# Patient Record
Sex: Female | Born: 1975 | Race: Black or African American | Hispanic: No | Marital: Single | Smoking: Current every day smoker
Health system: Southern US, Community
[De-identification: ages and names within clinical notes are randomized; demographics above are authoritative.]

## PROBLEM LIST (undated history)

## (undated) DIAGNOSIS — Z8669 Personal history of other diseases of the nervous system and sense organs: Secondary | ICD-10-CM

## (undated) HISTORY — PX: TUBAL LIGATION: SHX77

## (undated) HISTORY — DX: Personal history of other diseases of the nervous system and sense organs: Z86.69

---

## 1997-08-08 ENCOUNTER — Inpatient Hospital Stay (HOSPITAL_COMMUNITY): Admission: AD | Admit: 1997-08-08 | Discharge: 1997-08-11 | Payer: Self-pay | Admitting: Obstetrics and Gynecology

## 1999-12-11 ENCOUNTER — Other Ambulatory Visit: Admission: RE | Admit: 1999-12-11 | Discharge: 1999-12-11 | Payer: Self-pay | Admitting: Obstetrics & Gynecology

## 2000-05-08 ENCOUNTER — Emergency Department (HOSPITAL_COMMUNITY): Admission: EM | Admit: 2000-05-08 | Discharge: 2000-05-08 | Payer: Self-pay | Admitting: Emergency Medicine

## 2000-05-17 ENCOUNTER — Encounter: Payer: Self-pay | Admitting: Gastroenterology

## 2000-05-17 ENCOUNTER — Encounter: Admission: RE | Admit: 2000-05-17 | Discharge: 2000-05-17 | Payer: Self-pay | Admitting: Gastroenterology

## 2000-12-10 ENCOUNTER — Other Ambulatory Visit: Admission: RE | Admit: 2000-12-10 | Discharge: 2000-12-10 | Payer: Self-pay | Admitting: Obstetrics and Gynecology

## 2001-01-10 ENCOUNTER — Inpatient Hospital Stay (HOSPITAL_COMMUNITY): Admission: AD | Admit: 2001-01-10 | Discharge: 2001-01-10 | Payer: Self-pay | Admitting: Obstetrics and Gynecology

## 2001-01-11 ENCOUNTER — Encounter: Payer: Self-pay | Admitting: Obstetrics and Gynecology

## 2001-06-21 ENCOUNTER — Inpatient Hospital Stay (HOSPITAL_COMMUNITY): Admission: AD | Admit: 2001-06-21 | Discharge: 2001-06-21 | Payer: Self-pay | Admitting: Obstetrics and Gynecology

## 2001-07-10 ENCOUNTER — Inpatient Hospital Stay (HOSPITAL_COMMUNITY): Admission: AD | Admit: 2001-07-10 | Discharge: 2001-07-12 | Payer: Self-pay | Admitting: Obstetrics and Gynecology

## 2002-11-11 ENCOUNTER — Other Ambulatory Visit: Admission: RE | Admit: 2002-11-11 | Discharge: 2002-11-11 | Payer: Self-pay | Admitting: Obstetrics and Gynecology

## 2003-06-09 ENCOUNTER — Emergency Department (HOSPITAL_COMMUNITY): Admission: EM | Admit: 2003-06-09 | Discharge: 2003-06-09 | Payer: Self-pay | Admitting: Emergency Medicine

## 2012-10-13 ENCOUNTER — Telehealth: Payer: Self-pay | Admitting: *Deleted

## 2012-10-14 ENCOUNTER — Encounter: Payer: Self-pay | Admitting: Family Medicine

## 2012-10-17 ENCOUNTER — Ambulatory Visit: Payer: Self-pay | Admitting: Family Medicine

## 2012-10-22 NOTE — Telephone Encounter (Signed)
error 

## 2013-03-23 ENCOUNTER — Ambulatory Visit (INDEPENDENT_AMBULATORY_CARE_PROVIDER_SITE_OTHER): Payer: BC Managed Care – PPO | Admitting: Family Medicine

## 2013-03-23 ENCOUNTER — Encounter: Payer: Self-pay | Admitting: Family Medicine

## 2013-03-23 VITALS — BP 140/92 | HR 72 | Resp 16 | Ht 63.0 in | Wt 160.0 lb

## 2013-03-23 DIAGNOSIS — R5381 Other malaise: Secondary | ICD-10-CM

## 2013-03-23 DIAGNOSIS — Z13220 Encounter for screening for lipoid disorders: Secondary | ICD-10-CM

## 2013-03-23 DIAGNOSIS — Z23 Encounter for immunization: Secondary | ICD-10-CM

## 2013-03-23 DIAGNOSIS — R21 Rash and other nonspecific skin eruption: Secondary | ICD-10-CM

## 2013-03-23 DIAGNOSIS — N643 Galactorrhea not associated with childbirth: Secondary | ICD-10-CM

## 2013-03-23 MED ORDER — TRIAMCINOLONE ACETONIDE 0.1 % EX CREA: TOPICAL_CREAM | Freq: Two times a day (BID) | CUTANEOUS | Status: AC

## 2013-03-23 NOTE — Progress Notes (Signed)
  Subjective:    Patient ID: Gabrielle Yang, female    DOB: 1975/06/26, 37 y.o.   MRN: [id number]  HPI  Gabrielle Yang is here today to discuss the conditions listed below:    1)  Hand Rash:  Her daughter was recently diagnosed with scabies.  Her daughter's doctor gave treatment for the entire family and she has been applying this cream.  She feels that she has over-treated her hands and they have been itching and feeling extremely hot.    2)  Galactorrhea:  She has noted some milk discharge on several occasions.  She is concerned about her thyroid level since her daughter was recently diagnosed with thyroid disease.    Review of Systems  Constitutional: Negative.   HENT: Negative.   Eyes: Negative.   Respiratory: Negative.   Cardiovascular: Negative.   Gastrointestinal: Negative.   Endocrine:       Milk Discharge  Genitourinary: Negative.   Musculoskeletal: Negative.   Skin: Positive for rash.       Both hands  Allergic/Immunologic: Negative.   Neurological: Negative.   Hematological: Negative.   Psychiatric/Behavioral: Negative.      Past Medical History  Diagnosis Date  . History of migraine headaches      Family History  Problem Relation Age of Onset  . Cancer Mother     Uterine Cancer  . Hypertension Father   . Hypothyroidism Sister   . Hypothyroidism Daughter      History   Social History Narrative   Marital Status:  Engaged Air traffic controller)    Children:  Daughters (Yvonna Alanis, McNary)    Pets:  None    Living Situation: Lives with fiance and daughters.     Occupation:  Social worker    Tobacco Use/Exposure:  She smokes < 1/2 pack per week.    Alcohol Use:  Rarely    Drug Use:  None   Diet:  Regular   Exercise:  Walking    Hobbies:  Reading     No Known Allergies     Objective:   Physical Exam  Vitals reviewed. Constitutional: She is oriented to person, place, and time. She appears  well-developed and well-nourished.  Cardiovascular: Normal rate and regular rhythm.   Pulmonary/Chest: Effort normal and breath sounds normal.  Neurological: She is alert and oriented to person, place, and time.  Skin: Skin is warm and dry.  Psychiatric: She has a normal mood and affect.      Assessment & Plan:    Gabrielle Yang was seen today for rash and galactorrhea.  Diagnoses and associated orders for this visit:  Rash and nonspecific skin eruption - triamcinolone cream (KENALOG) 0.1 %; Apply topically 2 (two) times daily.  Galactorrhea - Prolactin; Future - B-HCG Quant; Future  Other malaise and fatigue - CBC with Differential; Future - COMPLETE METABOLIC PANEL WITH GFR; Future - TSH; Future  Screening, lipid - Cholesterol, Total; Future  Need for prophylactic vaccination and inoculation against influenza - Flu Vaccine QUAD 36+ mos PF IM (Fluarix)   TIME SPENT "FACE TO FACE" WITH PATIENT -  30 MINS

## 2013-04-21 DIAGNOSIS — Z23 Encounter for immunization: Secondary | ICD-10-CM | POA: Insufficient documentation

## 2013-04-21 DIAGNOSIS — R21 Rash and other nonspecific skin eruption: Secondary | ICD-10-CM | POA: Insufficient documentation

## 2013-04-21 DIAGNOSIS — N643 Galactorrhea not associated with childbirth: Secondary | ICD-10-CM | POA: Insufficient documentation

## 2013-05-14 NOTE — Assessment & Plan Note (Signed)
The patient confirmed that they are not allergic to eggs and have never had a bad reaction with the flu shot in the past.  The vaccination was given without difficulty.   

## 2013-05-24 DIAGNOSIS — R5381 Other malaise: Secondary | ICD-10-CM | POA: Insufficient documentation

## 2013-05-24 DIAGNOSIS — Z13220 Encounter for screening for lipoid disorders: Secondary | ICD-10-CM | POA: Insufficient documentation

## 2013-08-05 ENCOUNTER — Other Ambulatory Visit: Payer: BC Managed Care – PPO

## 2013-08-07 ENCOUNTER — Other Ambulatory Visit: Payer: BC Managed Care – PPO

## 2013-08-12 ENCOUNTER — Ambulatory Visit: Payer: BC Managed Care – PPO | Admitting: Family Medicine

## 2016-01-12 ENCOUNTER — Emergency Department (HOSPITAL_BASED_OUTPATIENT_CLINIC_OR_DEPARTMENT_OTHER): Payer: BLUE CROSS/BLUE SHIELD

## 2016-01-12 ENCOUNTER — Encounter (HOSPITAL_BASED_OUTPATIENT_CLINIC_OR_DEPARTMENT_OTHER): Payer: Self-pay

## 2016-01-12 ENCOUNTER — Emergency Department (HOSPITAL_BASED_OUTPATIENT_CLINIC_OR_DEPARTMENT_OTHER)
Admission: EM | Admit: 2016-01-12 | Discharge: 2016-01-12 | Disposition: A | Discharge status: Home / Self Care | Payer: BLUE CROSS/BLUE SHIELD | Attending: Emergency Medicine | Admitting: Emergency Medicine

## 2016-01-12 DIAGNOSIS — R1032 Left lower quadrant pain: Secondary | ICD-10-CM | POA: Insufficient documentation

## 2016-01-12 DIAGNOSIS — F17210 Nicotine dependence, cigarettes, uncomplicated: Secondary | ICD-10-CM | POA: Insufficient documentation

## 2016-01-12 LAB — URINE MICROSCOPIC-ADD ON

## 2016-01-12 LAB — URINALYSIS, ROUTINE W REFLEX MICROSCOPIC
BILIRUBIN URINE: NEGATIVE
Glucose, UA: NEGATIVE mg/dL
Ketones, ur: NEGATIVE mg/dL
LEUKOCYTES UA: NEGATIVE
NITRITE: NEGATIVE
PH: 5.5 (ref 5.0–8.0)
Protein, ur: NEGATIVE mg/dL
SPECIFIC GRAVITY, URINE: 1.021 (ref 1.005–1.030)

## 2016-01-12 LAB — WET PREP, GENITAL
SPERM: NONE SEEN
Trich, Wet Prep: NONE SEEN
YEAST WET PREP: NONE SEEN

## 2016-01-12 LAB — PREGNANCY, URINE: Preg Test, Ur: NEGATIVE

## 2016-01-12 MED ORDER — IBUPROFEN 800 MG PO TABS: 800.0000 mg | ORAL_TABLET | Freq: Three times a day (TID) | ORAL | 0 refills | Status: AC

## 2016-01-12 MED FILL — IBUPROFEN 800 MG TABLET: 800 | 7 days supply | Qty: 21 | Fill #0

## 2016-01-12 NOTE — ED Provider Notes (Signed)
[facility]   CSN: [id number] Arrival date & time: 01/12/16  1241  First Provider Contact:  None       History   Chief Complaint Chief Complaint  Patient presents with  . Abdominal Pain    HPI AMAIRANI SHUEY is a 40 y.o. female.  HPI  Patient is a pleasant 40 year old female presenting with pain in her left plain gradient to her left groin. This going on for 3 weeks. Patient went to urgent care and was diagnosed with pelvic pain NOS. Patient's been unable to get into her primary care physician. Patient has not had any discharge. Patient has been menstruating. Patient denies any nausea vomiting diarrhea. No fevers. No new vaginal discharge.   Past Medical History:  Diagnosis Date  . History of migraine headaches     Patient Active Problem List   Diagnosis Date Noted  . Screening, lipid 05/24/2013  . Other malaise and fatigue 05/24/2013  . Galactorrhea 04/21/2013  . Need for prophylactic vaccination and inoculation against influenza 04/21/2013  . Rash and nonspecific skin eruption 04/21/2013    Past Surgical History:  Procedure Laterality Date  . TUBAL LIGATION    . TUBAL LIGATION      OB History    No data available       Home Medications    Prior to Admission medications   Not on File    Family History Family History  Problem Relation Age of Onset  . Cancer Mother     Uterine Cancer  . Hypertension Father   . Hypothyroidism Sister   . Hypothyroidism Daughter     Social History Social History  Substance Use Topics  . Smoking status: Current Every Day Smoker    Packs/day: 0.25    Types: Cigarettes  . Smokeless tobacco: Never Used  . Alcohol use Yes     Comment: Occasional      Allergies   Review of patient's allergies indicates no known allergies.   Review of Systems Review of Systems  Constitutional: Negative for activity change.  Respiratory: Negative for shortness of breath.   Cardiovascular: Negative  for chest pain.  Gastrointestinal: Positive for abdominal pain. Negative for diarrhea, nausea and vomiting.  Genitourinary: Negative for dysuria.  All other systems reviewed and are negative.    Physical Exam Updated Vital Signs BP 132/95 (BP Location: Left Arm)   Pulse 96   Temp 98.4 F (36.9 C) (Oral)   Resp 18   Ht  (1.6 m)   Wt 175 lb (79.4 kg)   LMP 01/08/2016   SpO2 98%   BMI 31.00 kg/m   Physical Exam  Constitutional: She appears well-developed and well-nourished. No distress.  HENT:  Head: Normocephalic and atraumatic.  Eyes: Conjunctivae are normal.  Neck: Neck supple.  Cardiovascular: Normal rate and regular rhythm.   No murmur heard. Pulmonary/Chest: Effort normal and breath sounds normal. No respiratory distress.  Abdominal: Soft. There is tenderness.  CVA tenderness, mild tenderness left groin.  Genitourinary:  Genitourinary Comments: nromal vaginal exam no tenderness no cmt no discharge, +scant blood  Musculoskeletal: She exhibits no edema.  Neurological: She is alert.  Skin: Skin is warm and dry.  Psychiatric: She has a normal mood and affect.  Nursing note and vitals reviewed.    ED Treatments / Results  Labs (all labs ordered are listed, but only abnormal results are displayed) Labs Reviewed  URINALYSIS, ROUTINE W REFLEX MICROSCOPIC (NOT AT [facility]) - Abnormal; Notable for  the following:       Result Value   APPearance CLOUDY (*)    Hgb urine dipstick SMALL (*)    All other components within normal limits  URINE MICROSCOPIC-ADD ON - Abnormal; Notable for the following:    Squamous Epithelial / LPF 0-5 (*)    Bacteria, UA RARE (*)    All other components within normal limits  URINE CULTURE  WET PREP, GENITAL  PREGNANCY, URINE  GC/CHLAMYDIA PROBE AMP () NOT AT [facility]    EKG  EKG Interpretation None       Radiology Ct Renal Stone Study  Result Date: 01/12/2016 CLINICAL DATA:  Left flank and lower back pain. EXAM: CT  ABDOMEN AND PELVIS WITHOUT CONTRAST TECHNIQUE: Multidetector CT imaging of the abdomen and pelvis was performed following the standard protocol without IV contrast. COMPARISON:  Report dated 05/08/2000. FINDINGS: Lower chest:  3 mm nodule in the right lower lobe on image number 3. Hepatobiliary: Unremarkable liver and gallbladder. Pancreas: No mass or inflammatory process identified on this un-enhanced exam. Spleen: Within normal limits in size and appearance. Adrenals/Urinary Tract: Normal appearing adrenal glands, kidneys, ureters and urinary bladder. No calculi or hydronephrosis. No visible mass. Stomach/Bowel: No gastrointestinal abnormalities. Normal appearing appendix. Vascular/Lymphatic: No pathologically enlarged lymph nodes. No evidence of abdominal aortic aneurysm. Reproductive: No mass or other significant abnormality. Other: Very small umbilical hernia containing fat. Musculoskeletal: Facet degenerative changes at the L5-S1 level. Mild anterior spur formation at multiple levels of the lumbar and lower thoracic spine. IMPRESSION: 1. No acute abnormality. 2. 3 mm right lower lobe nodule. No follow-up needed if patient is low-risk. Non-contrast chest CT can be considered in 12 months if patient is high-risk. This recommendation follows the consensus statement: Guidelines for Management of Incidental Pulmonary Nodules Detected on CT Images:From the Fleischner Society 2017; published online before print (10.1148/radiol.[id number]). Electronically Signed   By: Beckie Salts M.D.   On: 01/12/2016 13:51    Procedures Procedures (including critical care time)  Medications Ordered in ED Medications - No data to display   Initial Impression / Assessment and Plan / ED Course  I have reviewed the triage vital signs and the nursing notes.  Pertinent labs & imaging results that were available during my care of the patient were reviewed by me and considered in my medical decision making (see chart for  details).  Clinical Course    Patient is a 40 year old female presenting with left flank pain rating to groin. She's had this for 3 weeks. Called her PCP X2 and went to urgent care. She reports that is colicky in nature. She's never had a history of kidney stones. Patient has had no new vaginal discharge. No urinary symptoms. We'll get CT stone. Patient otherwise appears very well with normal vital signs afebrile.  3:17 PM Normal CT, vaginal exam noraml.  Will get TVUS. If negative will have her follow up with PCP.     Final Clinical Impressions(s) / ED Diagnoses   Final diagnoses:  None    New Prescriptions New Prescriptions   No medications on file     Dariyon Urquilla Randall An, MD 01/12/16 [phone]

## 2016-01-12 NOTE — ED Notes (Signed)
Patient transported to Ultrasound 

## 2016-01-12 NOTE — ED Triage Notes (Signed)
C/o lower abd pain since Sunday-diarrhea, vaginal d/c-lower back pain x 2 weeks-NAD-steady gait

## 2016-01-12 NOTE — Discharge Instructions (Signed)
We are sorry we could not get to the bottom of why you are having this pain. Please follow up with your regular phsyician and return with any concerns, fever, or increasing pain.

## 2016-01-13 LAB — GC/CHLAMYDIA PROBE AMP (~~LOC~~) NOT AT ARMC
Chlamydia: NEGATIVE
Neisseria Gonorrhea: NEGATIVE

## 2016-01-13 LAB — URINE CULTURE

## 2016-12-30 IMAGING — US US TRANSVAGINAL NON-OB
1 series · 13 of 25 positions shown · non-contrast
Comparison: None in PACs

CLINICAL DATA: Left flank pain radiating to the left lower quadrant
for the past 2 weeks; irregular menses, bilateral tubal ligation.

EXAM:
TRANSABDOMINAL AND TRANSVAGINAL ULTRASOUND OF PELVIS
TECHNIQUE: Both transabdominal and transvaginal ultrasound examinations of the
pelvis were performed. Transabdominal technique was performed for
global imaging of the pelvis including uterus, ovaries, adnexal
regions, and pelvic cul-de-sac. It was necessary to proceed with
endovaginal exam following the transabdominal exam to visualize the
uterus, endometrium, ovaries, and adnexal structures..

[Series 1: us transvaginal non-ob · 0.25mm/px · 13 of 46 slices shown]
[im 1/46]
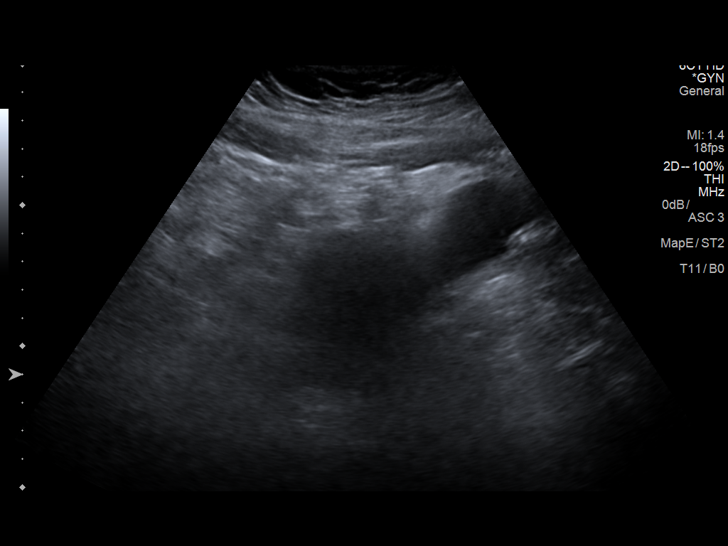
[im 4/46]
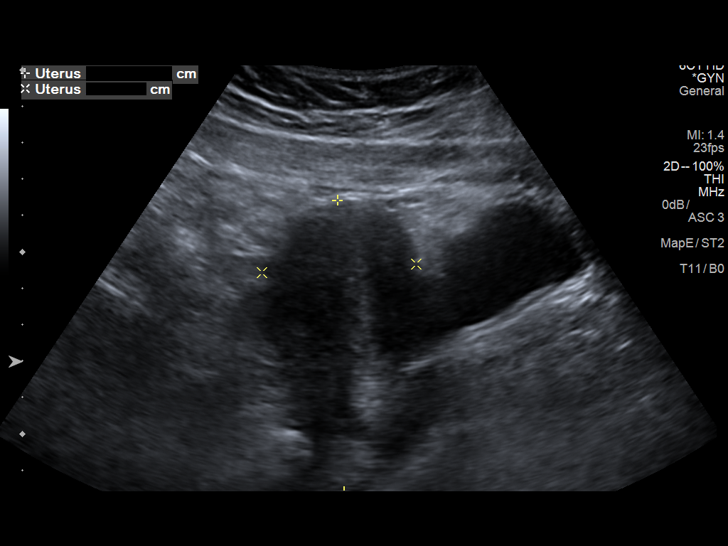
[im 8/46]
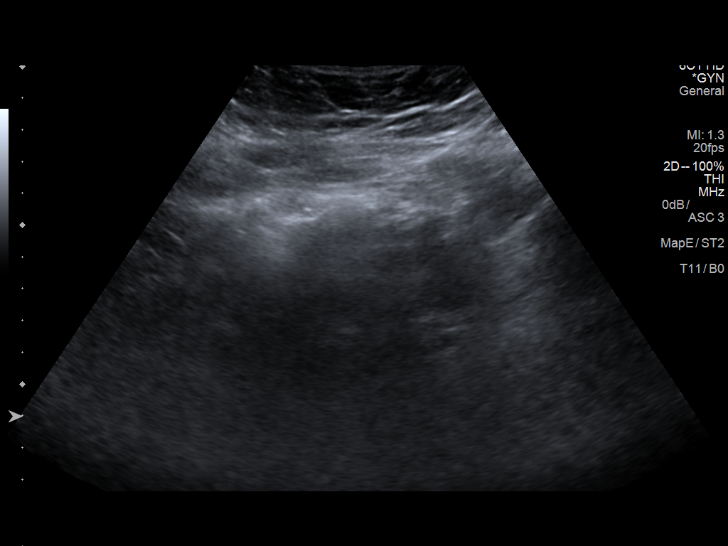
[im 12/46]
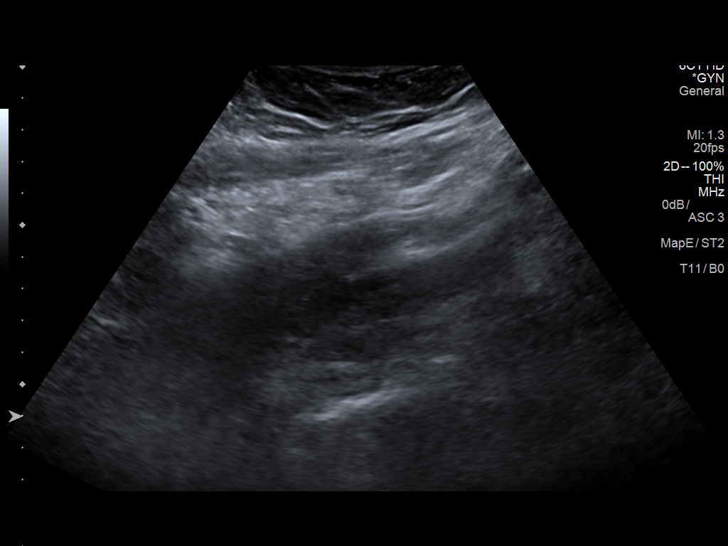
[im 16/46]
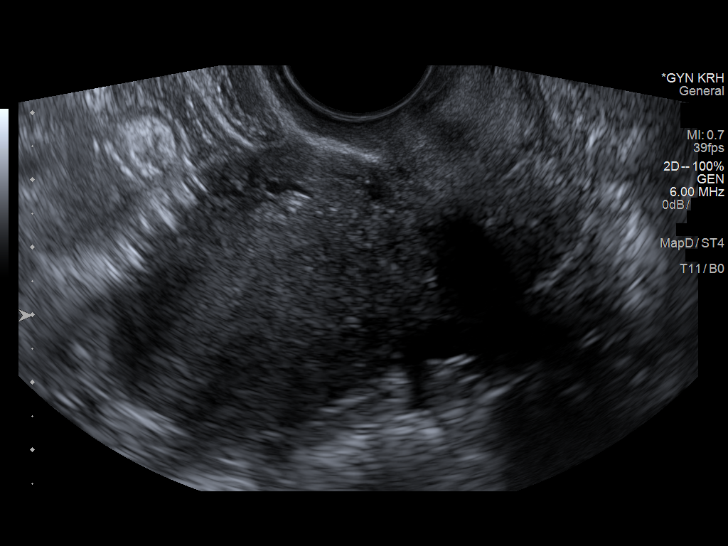
[im 19/46]
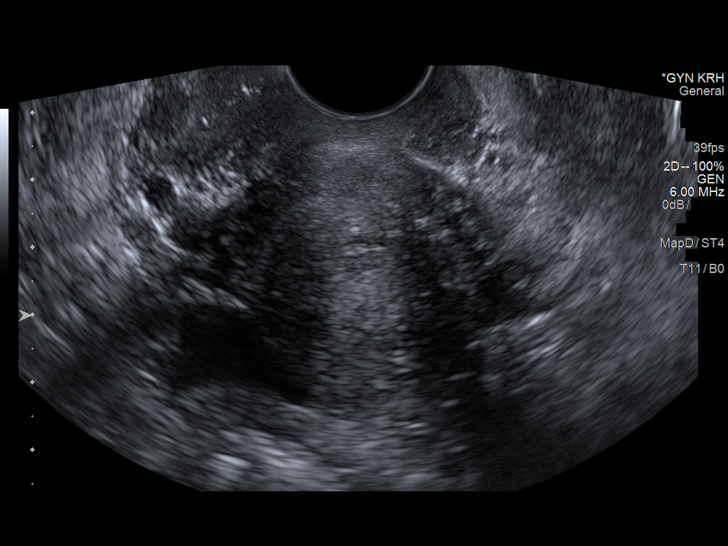
[im 23/46]
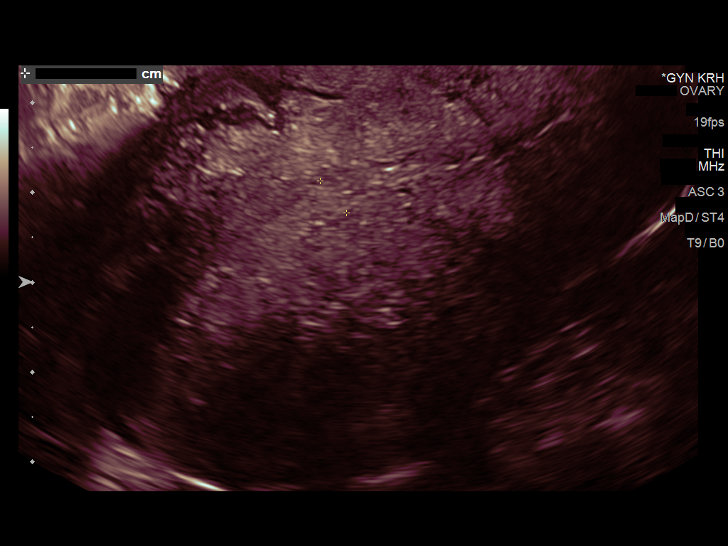
[im 27/46]
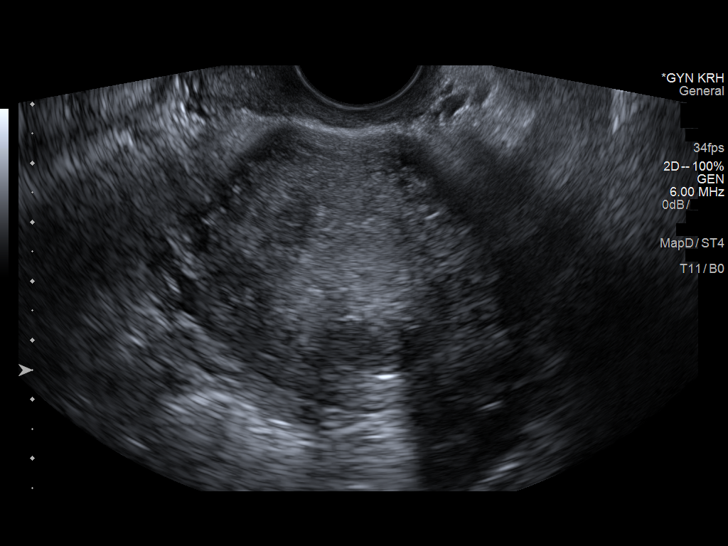
[im 31/46]
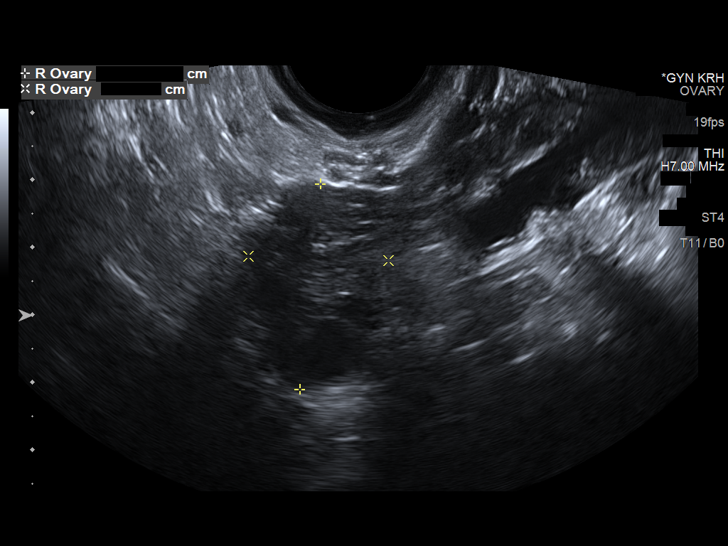
[im 34/46]
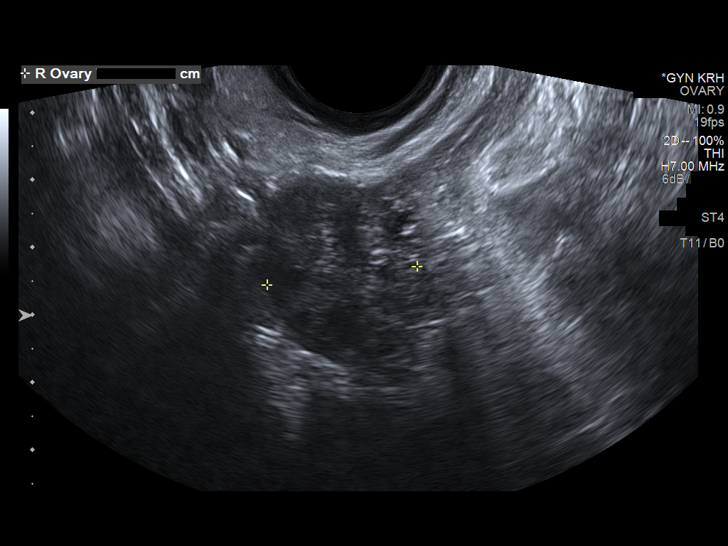
[im 38/46]
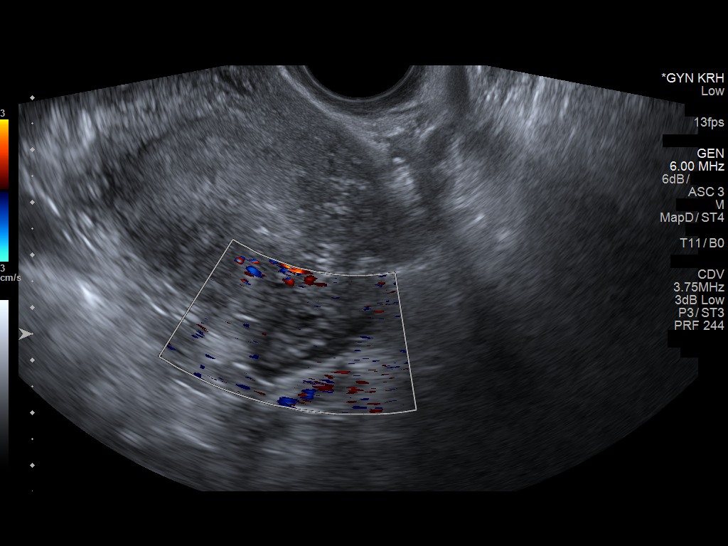
[im 42/46]
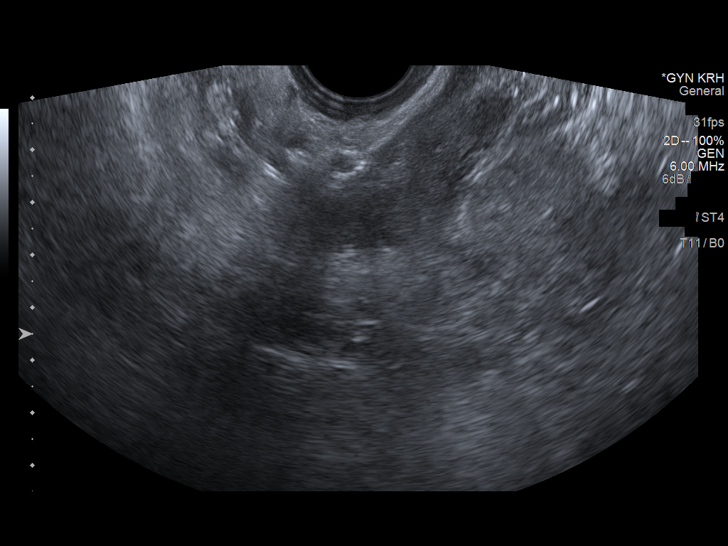
[im 46/46]
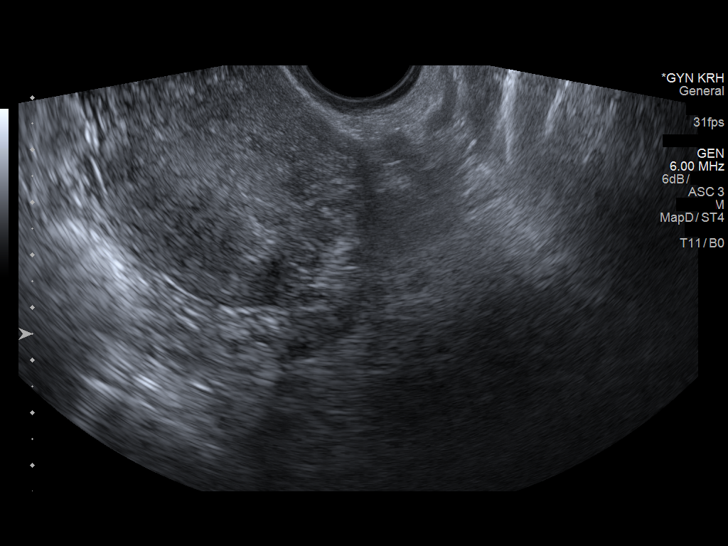

[13 of 25 positions shown; findings below may reference images not displayed]

FINDINGS: Uterus

Measurements: 7.8 x 4.6 x 5.5 cm. Posteriorly there is a hypoechoic
focus measuring approximately 1 x 1.6 x 1.4 cm that likely reflects
a fibroid.

Endometrium

Thickness: 5 mm. No fluid collections or endometrial masses are
observed.

Right ovary

Measurements: 3.1 x 2.1 x 2.2 cm. Normal appearance/no adnexal mass.

Left ovary

Measurements: 2.9 x 1.4 x 2.2 cm. Normal appearance/no adnexal mass.

Other findings

There is no free pelvic fluid.
IMPRESSION: 1. Small posterior uterine fibroid. The uterus and its endometrium
are otherwise normal.
2. Normal appearance of the ovaries and adnexal regions. No free
pelvic fluid.
